# Patient Record
Sex: Male | Born: 1995 | Race: Black or African American | Hispanic: No | Marital: Single | Smoking: Current some day smoker
Health system: Southern US, Community
[De-identification: ages and names within clinical notes are randomized; demographics above are authoritative.]

## PROBLEM LIST (undated history)

## (undated) DIAGNOSIS — J45909 Unspecified asthma, uncomplicated: Secondary | ICD-10-CM

---
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
## 2016-07-07 ENCOUNTER — Emergency Department (HOSPITAL_BASED_OUTPATIENT_CLINIC_OR_DEPARTMENT_OTHER)
Admission: EM | Admit: 2016-07-07 | Discharge: 2016-07-08 | Disposition: A | Discharge status: Home / Self Care | Payer: Self-pay | Attending: Emergency Medicine | Admitting: Emergency Medicine

## 2016-07-07 ENCOUNTER — Encounter (HOSPITAL_BASED_OUTPATIENT_CLINIC_OR_DEPARTMENT_OTHER): Payer: Self-pay | Admitting: *Deleted

## 2016-07-07 ENCOUNTER — Emergency Department (HOSPITAL_BASED_OUTPATIENT_CLINIC_OR_DEPARTMENT_OTHER): Payer: Self-pay

## 2016-07-07 DIAGNOSIS — Y999 Unspecified external cause status: Secondary | ICD-10-CM | POA: Insufficient documentation

## 2016-07-07 DIAGNOSIS — W1839XA Other fall on same level, initial encounter: Secondary | ICD-10-CM | POA: Insufficient documentation

## 2016-07-07 DIAGNOSIS — Y92019 Unspecified place in single-family (private) house as the place of occurrence of the external cause: Secondary | ICD-10-CM | POA: Insufficient documentation

## 2016-07-07 DIAGNOSIS — Y9302 Activity, running: Secondary | ICD-10-CM | POA: Insufficient documentation

## 2016-07-07 DIAGNOSIS — S0990XA Unspecified injury of head, initial encounter: Secondary | ICD-10-CM

## 2016-07-07 DIAGNOSIS — S0081XA Abrasion of other part of head, initial encounter: Secondary | ICD-10-CM | POA: Insufficient documentation

## 2016-07-07 MED ORDER — ONDANSETRON 4 MG PO TBDP
4.0000 mg | ORAL_TABLET | Freq: Once | ORAL | Status: AC
  Administered 2016-07-07: 4 mg via ORAL
  Filled 2016-07-07: qty 1

## 2016-07-07 NOTE — ED Triage Notes (Addendum)
He was running in the house, playing per friend, and ran into a wall. Small red mark on his left forehead. No LOC. He has an abrasion to the palm of his left hand that looks like he fell on gravel. He does not know how he got the abrasion.
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
## 2016-07-08 NOTE — ED Notes (Signed)
Friends asking if the patient is going to get any pain medications or any Rx. Friends are standing the patient up and trying to have him leave prior to this RN finished with d/c instructions. Was asked to sit and wait until instructions completed and the patient is aware of the instructions .

## 2017-01-03 ENCOUNTER — Emergency Department (HOSPITAL_COMMUNITY)
Admission: EM | Admit: 2017-01-03 | Discharge: 2017-01-03 | Disposition: A | Discharge status: Home / Self Care | Payer: Self-pay | Attending: Emergency Medicine | Admitting: Emergency Medicine

## 2017-01-03 ENCOUNTER — Encounter (HOSPITAL_COMMUNITY): Payer: Self-pay | Admitting: Emergency Medicine

## 2017-01-03 DIAGNOSIS — F17200 Nicotine dependence, unspecified, uncomplicated: Secondary | ICD-10-CM | POA: Insufficient documentation

## 2017-01-03 DIAGNOSIS — R369 Urethral discharge, unspecified: Secondary | ICD-10-CM | POA: Insufficient documentation

## 2017-01-03 DIAGNOSIS — J45909 Unspecified asthma, uncomplicated: Secondary | ICD-10-CM | POA: Insufficient documentation

## 2017-01-03 HISTORY — DX: Unspecified asthma, uncomplicated: J45.909

## 2017-01-03 MED ORDER — LIDOCAINE HCL 1 % IJ SOLN
INTRAMUSCULAR | Status: AC
  Administered 2017-01-03: 1.5 mL
  Filled 2017-01-03: qty 20

## 2017-01-03 MED ORDER — ONDANSETRON 8 MG PO TBDP
8.0000 mg | ORAL_TABLET | Freq: Once | ORAL | Status: AC
  Administered 2017-01-03: 8 mg via ORAL
  Filled 2017-01-03: qty 1

## 2017-01-03 MED ORDER — CEFTRIAXONE SODIUM 250 MG IJ SOLR
250.0000 mg | Freq: Once | INTRAMUSCULAR | Status: AC
  Administered 2017-01-03: 250 mg via INTRAMUSCULAR
  Filled 2017-01-03: qty 250

## 2017-01-03 MED ORDER — METRONIDAZOLE 500 MG PO TABS
2000.0000 mg | ORAL_TABLET | Freq: Once | ORAL | Status: AC
  Administered 2017-01-03: 2000 mg via ORAL
  Filled 2017-01-03: qty 4

## 2017-01-03 MED ORDER — AZITHROMYCIN 250 MG PO TABS
1000.0000 mg | ORAL_TABLET | Freq: Once | ORAL | Status: AC
  Administered 2017-01-03: 1000 mg via ORAL
  Filled 2017-01-03: qty 4

## 2017-01-03 NOTE — ED Triage Notes (Signed)
Pt reports brown penile drainage and burning on urination since last night

## 2017-01-03 NOTE — Discharge Instructions (Signed)
Read the information below.  You may return to the Emergency Department at any time for worsening condition or any new symptoms that concern you.    Your sexual partner(s) must be tested and treated for STDs.  You can find out your results on My Chart in a few days.
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
## 2017-01-04 LAB — RPR: RPR: NONREACTIVE

## 2017-01-04 LAB — HIV ANTIBODY (ROUTINE TESTING W REFLEX): HIV SCREEN 4TH GENERATION: NONREACTIVE

## 2017-01-05 LAB — GC/CHLAMYDIA PROBE AMP (~~LOC~~) NOT AT ARMC
CHLAMYDIA, DNA PROBE: NEGATIVE
NEISSERIA GONORRHEA: POSITIVE — AB

## 2018-07-13 ENCOUNTER — Encounter: Payer: Self-pay | Admitting: Pediatrics

## 2018-07-13 DIAGNOSIS — Z1379 Encounter for other screening for genetic and chromosomal anomalies: Secondary | ICD-10-CM | POA: Insufficient documentation

## 2020-07-13 ENCOUNTER — Encounter (HOSPITAL_COMMUNITY): Payer: Self-pay | Admitting: Emergency Medicine

## 2020-07-13 ENCOUNTER — Emergency Department (HOSPITAL_COMMUNITY): Payer: Self-pay

## 2020-07-13 ENCOUNTER — Emergency Department (HOSPITAL_COMMUNITY)
Admission: EM | Admit: 2020-07-13 | Discharge: 2020-07-13 | Disposition: A | Discharge status: Home / Self Care | Payer: Self-pay | Attending: Emergency Medicine | Admitting: Emergency Medicine

## 2020-07-13 ENCOUNTER — Other Ambulatory Visit: Payer: Self-pay

## 2020-07-13 DIAGNOSIS — W3400XA Accidental discharge from unspecified firearms or gun, initial encounter: Secondary | ICD-10-CM

## 2020-07-13 DIAGNOSIS — S71132A Puncture wound without foreign body, left thigh, initial encounter: Secondary | ICD-10-CM

## 2020-07-13 DIAGNOSIS — F17200 Nicotine dependence, unspecified, uncomplicated: Secondary | ICD-10-CM | POA: Insufficient documentation

## 2020-07-13 DIAGNOSIS — Z1810 Retained metal fragments, unspecified: Secondary | ICD-10-CM | POA: Insufficient documentation

## 2020-07-13 DIAGNOSIS — Y92019 Unspecified place in single-family (private) house as the place of occurrence of the external cause: Secondary | ICD-10-CM | POA: Insufficient documentation

## 2020-07-13 DIAGNOSIS — Z20822 Contact with and (suspected) exposure to covid-19: Secondary | ICD-10-CM | POA: Insufficient documentation

## 2020-07-13 DIAGNOSIS — S71142A Puncture wound with foreign body, left thigh, initial encounter: Secondary | ICD-10-CM | POA: Insufficient documentation

## 2020-07-13 LAB — COMPREHENSIVE METABOLIC PANEL
ALT: 27 U/L (ref 0–44)
AST: 22 U/L (ref 15–41)
Albumin: 4.2 g/dL (ref 3.5–5.0)
Alkaline Phosphatase: 48 U/L (ref 38–126)
Anion gap: 11 (ref 5–15)
BUN: 15 mg/dL (ref 6–20)
CO2: 24 mmol/L (ref 22–32)
Calcium: 8.9 mg/dL (ref 8.9–10.3)
Chloride: 103 mmol/L (ref 98–111)
Creatinine, Ser: 1.34 mg/dL — ABNORMAL HIGH (ref 0.61–1.24)
GFR, Estimated: >60 mL/min (ref >60)
Glucose, Bld: 130 mg/dL — ABNORMAL HIGH (ref 70–99)
Potassium: 3.4 mmol/L — ABNORMAL LOW (ref 3.5–5.1)
Sodium: 138 mmol/L (ref 135–145)
Total Bilirubin: 0.4 mg/dL (ref 0.3–1.2)
Total Protein: 6.9 g/dL (ref 6.5–8.1)

## 2020-07-13 LAB — I-STAT CHEM 8, ED
BUN: 18 mg/dL (ref 6–20)
Calcium, Ion: 1.11 mmol/L — ABNORMAL LOW (ref 1.15–1.40)
Chloride: 102 mmol/L (ref 98–111)
Creatinine, Ser: 1.2 mg/dL (ref 0.61–1.24)
Glucose, Bld: 128 mg/dL — ABNORMAL HIGH (ref 70–99)
HCT: 47 % (ref 39.0–52.0)
Hemoglobin: 16 g/dL (ref 13.0–17.0)
Potassium: 3.3 mmol/L — ABNORMAL LOW (ref 3.5–5.1)
Sodium: 141 mmol/L (ref 135–145)
TCO2: 24 mmol/L (ref 22–32)

## 2020-07-13 LAB — CBC
HCT: 46.3 % (ref 39.0–52.0)
Hemoglobin: 14.8 g/dL (ref 13.0–17.0)
MCH: 28.1 pg (ref 26.0–34.0)
MCHC: 32 g/dL (ref 30.0–36.0)
MCV: 87.9 fL (ref 80.0–100.0)
Platelets: 314 10*3/uL (ref 150–400)
RBC: 5.27 MIL/uL (ref 4.22–5.81)
RDW: 12.3 % (ref 11.5–15.5)
WBC: 8.7 10*3/uL (ref 4.0–10.5)
nRBC: 0 % (ref 0.0–0.2)

## 2020-07-13 LAB — LACTIC ACID, PLASMA: Lactic Acid, Venous: 3.2 mmol/L (ref 0.5–1.9)

## 2020-07-13 LAB — SAMPLE TO BLOOD BANK

## 2020-07-13 LAB — PROTIME-INR
INR: 1.1 (ref 0.8–1.2)
Prothrombin Time: 13.3 seconds (ref 11.4–15.2)

## 2020-07-13 LAB — RESPIRATORY PANEL BY RT PCR (FLU A&B, COVID)
Influenza A by PCR: NEGATIVE
Influenza B by PCR: NEGATIVE
SARS Coronavirus 2 by RT PCR: NEGATIVE

## 2020-07-13 LAB — ETHANOL: Alcohol, Ethyl (B): <10 mg/dL (ref <10)

## 2020-07-13 MED ORDER — CEPHALEXIN 500 MG PO CAPS: 500.0000 mg | ORAL_CAPSULE | Freq: Four times a day (QID) | ORAL | 0 refills | Status: DC

## 2020-07-13 MED ORDER — MORPHINE SULFATE (PF) 4 MG/ML IV SOLN
4.0000 mg | Freq: Once | INTRAVENOUS | Status: AC
  Administered 2020-07-13: 4 mg via INTRAVENOUS
  Filled 2020-07-13: qty 1

## 2020-07-13 MED ORDER — IBUPROFEN 800 MG PO TABS: 800.0000 mg | ORAL_TABLET | Freq: Three times a day (TID) | ORAL | 0 refills | Status: DC | PRN

## 2020-07-13 MED ORDER — CEFAZOLIN SODIUM-DEXTROSE 1-4 GM/50ML-% IV SOLN
1.0000 g | Freq: Once | INTRAVENOUS | Status: AC
  Administered 2020-07-13: 1 g via INTRAVENOUS
  Filled 2020-07-13: qty 50
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
## 2020-07-13 NOTE — ED Notes (Signed)
Tourniquet removed @ 1736.
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
## 2020-07-14 ENCOUNTER — Encounter (HOSPITAL_COMMUNITY): Payer: Self-pay | Admitting: Emergency Medicine
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
## 2023-03-04 DIAGNOSIS — Z419 Encounter for procedure for purposes other than remedying health state, unspecified: Secondary | ICD-10-CM | POA: Diagnosis not present

## 2023-04-03 DIAGNOSIS — Z419 Encounter for procedure for purposes other than remedying health state, unspecified: Secondary | ICD-10-CM | POA: Diagnosis not present

## 2023-05-04 DIAGNOSIS — Z419 Encounter for procedure for purposes other than remedying health state, unspecified: Secondary | ICD-10-CM | POA: Diagnosis not present

## 2023-06-04 DIAGNOSIS — Z419 Encounter for procedure for purposes other than remedying health state, unspecified: Secondary | ICD-10-CM | POA: Diagnosis not present

## 2023-07-04 DIAGNOSIS — Z419 Encounter for procedure for purposes other than remedying health state, unspecified: Secondary | ICD-10-CM | POA: Diagnosis not present

## 2023-10-15 DIAGNOSIS — Z419 Encounter for procedure for purposes other than remedying health state, unspecified: Secondary | ICD-10-CM | POA: Diagnosis not present

## 2023-11-04 DIAGNOSIS — Z419 Encounter for procedure for purposes other than remedying health state, unspecified: Secondary | ICD-10-CM | POA: Diagnosis not present

## 2023-11-15 DIAGNOSIS — Z419 Encounter for procedure for purposes other than remedying health state, unspecified: Secondary | ICD-10-CM | POA: Diagnosis not present

## 2023-12-02 DIAGNOSIS — Z419 Encounter for procedure for purposes other than remedying health state, unspecified: Secondary | ICD-10-CM | POA: Diagnosis not present

## 2023-12-13 DIAGNOSIS — Z419 Encounter for procedure for purposes other than remedying health state, unspecified: Secondary | ICD-10-CM | POA: Diagnosis not present

## 2024-01-13 DIAGNOSIS — Z419 Encounter for procedure for purposes other than remedying health state, unspecified: Secondary | ICD-10-CM | POA: Diagnosis not present

## 2024-02-12 DIAGNOSIS — Z419 Encounter for procedure for purposes other than remedying health state, unspecified: Secondary | ICD-10-CM | POA: Diagnosis not present

## 2024-03-14 DIAGNOSIS — Z419 Encounter for procedure for purposes other than remedying health state, unspecified: Secondary | ICD-10-CM | POA: Diagnosis not present

## 2024-04-13 DIAGNOSIS — Z419 Encounter for procedure for purposes other than remedying health state, unspecified: Secondary | ICD-10-CM | POA: Diagnosis not present

## 2024-05-14 DIAGNOSIS — Z419 Encounter for procedure for purposes other than remedying health state, unspecified: Secondary | ICD-10-CM | POA: Diagnosis not present

## 2024-06-14 DIAGNOSIS — Z419 Encounter for procedure for purposes other than remedying health state, unspecified: Secondary | ICD-10-CM | POA: Diagnosis not present

## 2024-08-06 ENCOUNTER — Emergency Department (HOSPITAL_COMMUNITY)

## 2024-08-06 ENCOUNTER — Emergency Department (HOSPITAL_COMMUNITY)
Admission: EM | Admit: 2024-08-06 | Discharge: 2024-08-06 | Disposition: A | Discharge status: Home / Self Care | Attending: Emergency Medicine | Admitting: Emergency Medicine

## 2024-08-06 ENCOUNTER — Emergency Department (HOSPITAL_COMMUNITY): Admission: EM | Admit: 2024-08-06 | Discharge: 2024-08-06 | Discharge status: Against Medical Advice

## 2024-08-06 ENCOUNTER — Other Ambulatory Visit: Payer: Self-pay

## 2024-08-06 ENCOUNTER — Encounter (HOSPITAL_COMMUNITY): Payer: Self-pay

## 2024-08-06 DIAGNOSIS — R062 Wheezing: Secondary | ICD-10-CM | POA: Diagnosis present

## 2024-08-06 DIAGNOSIS — J4521 Mild intermittent asthma with (acute) exacerbation: Secondary | ICD-10-CM | POA: Insufficient documentation

## 2024-08-06 MED ORDER — FLUTICASONE PROPIONATE 50 MCG/ACT NA SUSP: 1.0000 | Freq: Every day | NASAL | 2 refills | Status: AC

## 2024-08-06 MED ORDER — ALBUTEROL SULFATE HFA 108 (90 BASE) MCG/ACT IN AERS
2.0000 | INHALATION_SPRAY | RESPIRATORY_TRACT | Status: DC | PRN
  Administered 2024-08-06: 2 via RESPIRATORY_TRACT
  Filled 2024-08-06: qty 6.7

## 2024-08-06 MED ORDER — ALBUTEROL SULFATE HFA 108 (90 BASE) MCG/ACT IN AERS: 1.0000 | INHALATION_SPRAY | Freq: Four times a day (QID) | RESPIRATORY_TRACT | 6 refills | Status: AC | PRN

## 2024-08-06 MED ORDER — CETIRIZINE HCL 10 MG PO TABS: 10.0000 mg | ORAL_TABLET | Freq: Every day | ORAL | 3 refills | Status: AC

## 2024-08-06 NOTE — ED Triage Notes (Signed)
 Pt is coming in for exposure to his mother cats, he mentions he felt a little wheezy but is otherwise stable in triage at this time. No other complaints at this time.

## 2024-08-06 NOTE — Discharge Instructions (Addendum)
 Use the inhaler as needed- 1-2 puffs every 4-6 hours. Zyrtec and Flonase daily.
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
## 2024-08-06 NOTE — ED Notes (Signed)
Pt name called to be triage, no response
